# Patient Record
Sex: Female | Born: 1950 | Race: White | Hispanic: No | Marital: Married | Smoking: Never smoker
Health system: Southern US, Community
[De-identification: ages and names within clinical notes are randomized; demographics above are authoritative.]

## PROBLEM LIST (undated history)

## (undated) DIAGNOSIS — I10 Essential (primary) hypertension: Secondary | ICD-10-CM

## (undated) HISTORY — PX: TONSILLECTOMY: SUR1361

## (undated) HISTORY — PX: COLON SURGERY: SHX602

## (undated) HISTORY — PX: INCONTINENCE SURGERY: SHX676

## (undated) HISTORY — PX: ABDOMINAL SURGERY: SHX537

## (undated) HISTORY — PX: COLON RESECTION: SHX5231

---

## 2005-11-15 ENCOUNTER — Ambulatory Visit: Payer: Self-pay | Admitting: Gastroenterology

## 2005-11-15 IMAGING — NM NUCLEAR MEDICINE GASTRIC EMPTYING STUDY
1 series · 6 of 6 positions shown · non-contrast
Comparison: none

REASON FOR EXAM: GERD: Dyspepsia
COMMENTS:

[Series 0: gastric empty · 1.7mm · 1.65mm/px · 6 of 20 frames shown]
[frame 2/20]
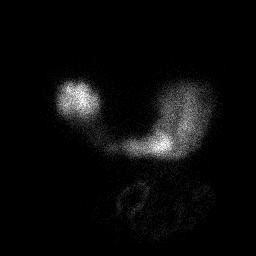
[frame 5/20]
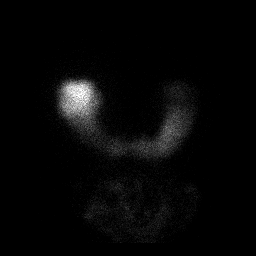
[frame 9/20]
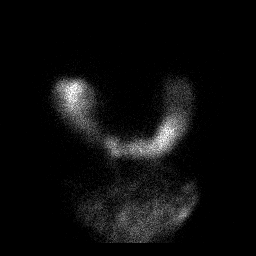
[frame 12/20]
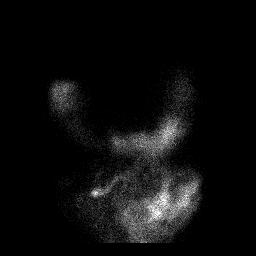
[frame 15/20]
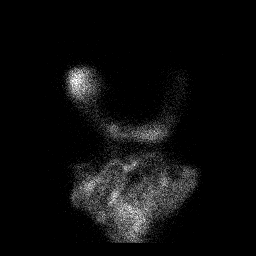
[frame 19/20]
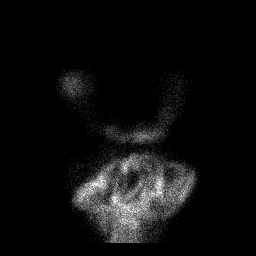

[6 of 6 positions shown; findings below may reference images not displayed]

PROCEDURE:     NM  - NM GASTRIC EMPTYING STUDY  - [DATE]  [DATE]

RESULT:          The patient has gastric reflux symptoms.  The patient's
gastric contents were labeled with 1.96 mCi of technetium 99-m labeled
sulfur colloid.  This was combined with two eggs, two slices of toast, and 8
ounces of water.

The 95-minute gastric emptying is normal at 98%.  There does not appear to
be any evidence of gastric outlet obstruction.  The normal expected upper
range of normal for gastric emptying by 95 minutes is 64%, suggesting that
the stomach is perhaps hyperdynamic, which may aggravate reflux.
IMPRESSION: There is no evidence of delay of gastric emptying with
nearly 98% of the patient's gastric contents emptied by 95 minutes.

## 2007-07-31 ENCOUNTER — Ambulatory Visit: Payer: Self-pay | Admitting: Family Medicine

## 2007-10-25 ENCOUNTER — Ambulatory Visit: Payer: Self-pay | Admitting: Family Medicine

## 2021-09-18 ENCOUNTER — Encounter: Payer: Self-pay | Admitting: Licensed Clinical Social Worker

## 2021-09-18 ENCOUNTER — Ambulatory Visit
Admission: EM | Admit: 2021-09-18 | Discharge: 2021-09-18 | Disposition: A | Discharge status: Home / Self Care | Payer: Medicare Other | Attending: Physician Assistant | Admitting: Physician Assistant

## 2021-09-18 ENCOUNTER — Other Ambulatory Visit: Payer: Self-pay

## 2021-09-18 DIAGNOSIS — N3000 Acute cystitis without hematuria: Secondary | ICD-10-CM | POA: Diagnosis present

## 2021-09-18 DIAGNOSIS — E860 Dehydration: Secondary | ICD-10-CM | POA: Diagnosis present

## 2021-09-18 LAB — URINALYSIS, COMPLETE (UACMP) WITH MICROSCOPIC
Bilirubin Urine: NEGATIVE
Glucose, UA: NEGATIVE mg/dL
Hgb urine dipstick: NEGATIVE
Ketones, ur: 15 mg/dL — AB
Nitrite: POSITIVE — AB
Protein, ur: NEGATIVE mg/dL
Specific Gravity, Urine: 1.02 (ref 1.005–1.030)
WBC, UA: >50 WBC/hpf (ref 0–5)
pH: 7 (ref 5.0–8.0)

## 2021-09-18 MED ORDER — PHENAZOPYRIDINE HCL 200 MG PO TABS: 200.0000 mg | ORAL_TABLET | Freq: Three times a day (TID) | ORAL | 0 refills | Status: DC

## 2021-09-18 MED ORDER — NITROFURANTOIN MONOHYD MACRO 100 MG PO CAPS: 100.0000 mg | ORAL_CAPSULE | Freq: Two times a day (BID) | ORAL | 0 refills | Status: AC

## 2021-09-18 NOTE — Discharge Instructions (Signed)
We are treating you for a UTI, you are mildly dehydrated. Avoid tub baths. Take meds as directed. Follow up with PCP, urine culture pending. Drink plenty of water.

## 2021-09-18 NOTE — ED Triage Notes (Signed)
Pt c/o painful urination, urgency x 1 day. Pain in bladder. Pt states she responds to macrobid antibiotic.

## 2021-09-18 NOTE — ED Provider Notes (Signed)
[facility]    CSN: [id number] Arrival date & time: 09/18/21  1428      History   Chief Complaint Chief Complaint  Patient presents with   Dysuria    HPI Kelli Carter is a 70 y.o. female.   70 year old female patient, Kelli Carter, presents to urgent care chief complaint of burning with urination and urgency starting today.  Patient states she has a history of UTIs with menopause and is on Estrace vaginal cream which has helped with this.  Patient states last UTI was over 6 months ago.  Patient endorses Macrobid as antibiotic that works for her for UTIs.  Patient denies any fever or back pain  The history is provided by the patient. No language interpreter was used.   History reviewed. No pertinent past medical history.  Patient Active Problem List   Diagnosis Date Noted   Acute cystitis without hematuria 09/18/2021   Dehydration 09/18/2021    History reviewed. No pertinent surgical history.  OB History   No obstetric history on file.      Home Medications    Prior to Admission medications   Medication Sig Start Date End Date Taking? Authorizing Provider  nitrofurantoin, macrocrystal-monohydrate, (MACROBID) 100 MG capsule Take 1 capsule (100 mg total) by mouth 2 (two) times daily for 5 days. 09/18/21 09/23/21 Yes Travian Kerner, Para March, NP  phenazopyridine (PYRIDIUM) 200 MG tablet Take 1 tablet (200 mg total) by mouth 3 (three) times daily. 09/18/21  Yes Chinaza Rooke, Para March, NP  estradiol (ESTRACE) 0.1 MG/GM vaginal cream SMARTSIG:0.25 Applicator Vaginal Twice a Week 07/27/21   [provider]    Family History History reviewed. No pertinent family history.  Social History Social History   Tobacco Use   Smoking status: Never   Smokeless tobacco: Never  Vaping Use   Vaping Use: Never used  Substance Use Topics   Alcohol use: Not Currently   Drug use: Never     Allergies   Keflex [cephalexin]   Review of Systems Review of Systems   Constitutional:  Negative for fever.  Gastrointestinal:  Negative for abdominal pain.  Genitourinary:  Positive for dysuria, frequency and urgency. Negative for flank pain.  Musculoskeletal:  Negative for back pain.  All other systems reviewed and are negative.   Physical Exam Triage Vital Signs ED Triage Vitals  Enc Vitals Group     BP 09/18/21 1512 122/81     Pulse Rate 09/18/21 1512 71     Resp 09/18/21 1512 16     Temp 09/18/21 1512 98.6 F (37 C)     Temp Source 09/18/21 1512 Oral     SpO2 09/18/21 1512 97 %     Weight 09/18/21 1505 137 lb (62.1 kg)     Height 09/18/21 1505 5\' 4"  (1.626 m)     Head Circumference --      Peak Flow --      Pain Score 09/18/21 1504 10     Pain Loc --      Pain Edu? --      Excl. in GC? --    No data found.  Updated Vital Signs BP 122/81 (BP Location: Left Arm)   Pulse 71   Temp 98.6 F (37 C) (Oral)   Resp 16   Ht 5\' 4"  (1.626 m)   Wt 137 lb (62.1 kg)   SpO2 97%   BMI 23.52 kg/m   Visual Acuity Right Eye Distance:   Left Eye Distance:   Bilateral Distance:  Right Eye Near:   Left Eye Near:    Bilateral Near:     Physical Exam Vitals and nursing note reviewed.  Constitutional:      General: She is not in acute distress.    Appearance: Normal appearance. She is well-developed and well-groomed.  HENT:     Head: Normocephalic.  Eyes:     Pupils: Pupils are equal, round, and reactive to light.  Cardiovascular:     Rate and Rhythm: Normal rate and regular rhythm.  Pulmonary:     Effort: Pulmonary effort is normal.     Breath sounds: Normal breath sounds.  Abdominal:     Tenderness: There is no abdominal tenderness.  Musculoskeletal:        General: Normal range of motion.     Cervical back: Normal range of motion.     Comments: Negative CVAT bilaterally  Skin:    General: Skin is warm and dry.  Neurological:     General: No focal deficit present.     Mental Status: She is alert and oriented to person, place,  and time.     GCS: GCS eye subscore is 4. GCS verbal subscore is 5. GCS motor subscore is 6.  Psychiatric:        Attention and Perception: Attention normal.        Mood and Affect: Mood normal.        Speech: Speech normal.        Behavior: Behavior normal. Behavior is cooperative.     UC Treatments / Results  Labs (all labs ordered are listed, but only abnormal results are displayed) Labs Reviewed  URINALYSIS, COMPLETE (UACMP) WITH MICROSCOPIC - Abnormal; Notable for the following components:      Result Value   APPearance HAZY (*)    Ketones, ur 15 (*)    Nitrite POSITIVE (*)    Leukocytes,Ua MODERATE (*)    Bacteria, UA MANY (*)    All other components within normal limits  URINE CULTURE    EKG   Radiology No results found.  Procedures Procedures (including critical care time)  Medications Ordered in UC Medications - No data to display  Initial Impression / Assessment and Plan / UC Course  I have reviewed the triage vital signs and the nursing notes.  Pertinent labs & imaging results that were available during my care of the patient were reviewed by me and considered in my medical decision making (see chart for details).     Ddx: UTI, Interstitial cystitis, pyelonephritis Final Clinical Impressions(s) / UC Diagnoses   Final diagnoses:  Acute cystitis without hematuria  Dehydration     Discharge Instructions      We are treating you for a UTI, you are mildly dehydrated. Avoid tub baths. Take meds as directed. Follow up with PCP, urine culture pending. Drink plenty of water.      ED Prescriptions     Medication Sig Dispense Auth. Provider   nitrofurantoin, macrocrystal-monohydrate, (MACROBID) 100 MG capsule Take 1 capsule (100 mg total) by mouth 2 (two) times daily for 5 days. 10 capsule Aamari Strawderman, Para March, NP   phenazopyridine (PYRIDIUM) 200 MG tablet Take 1 tablet (200 mg total) by mouth 3 (three) times daily. 6 tablet Latisha Lasch, Para March, NP       PDMP not reviewed this encounter.   Clancy Gourd, NP 09/18/21 1539

## 2021-09-20 LAB — URINE CULTURE: Culture: >=100000 — AB

## 2022-03-15 ENCOUNTER — Encounter: Payer: Self-pay | Admitting: Emergency Medicine

## 2022-03-15 ENCOUNTER — Ambulatory Visit
Admission: EM | Admit: 2022-03-15 | Discharge: 2022-03-15 | Disposition: A | Discharge status: Home / Self Care | Payer: Medicare Other | Attending: Internal Medicine | Admitting: Internal Medicine

## 2022-03-15 ENCOUNTER — Other Ambulatory Visit: Payer: Self-pay

## 2022-03-15 DIAGNOSIS — M79651 Pain in right thigh: Secondary | ICD-10-CM

## 2022-03-15 DIAGNOSIS — M79652 Pain in left thigh: Secondary | ICD-10-CM | POA: Diagnosis not present

## 2022-03-15 HISTORY — DX: Essential (primary) hypertension: I10

## 2022-03-15 MED ORDER — CYCLOBENZAPRINE HCL 5 MG PO TABS: 5.0000 mg | ORAL_TABLET | Freq: Every evening | ORAL | 0 refills | Status: DC | PRN

## 2022-03-15 NOTE — ED Triage Notes (Signed)
Patient c/o pain that starts in her upper thigh that travels down her left leg that started 4:15 today.  Patient denies injury or falls.  Patient can find no relief with lying or walking.  ?

## 2022-03-15 NOTE — Discharge Instructions (Signed)
Heating pad use will help with pain ?Gentle stretching exercises ?Please take medications as prescribed ?Please alternate ibuprofen and Tylenol as needed for pain ?If you have persistent pain or the pain becomes constant or does not improve in the next 24 hours please return to urgent care to be reevaluated. ?No indication for imaging studies at this time. ?Please do not drive or operate heavy machinery after taking muscle relaxants. ?

## 2022-03-18 NOTE — ED Provider Notes (Signed)
?[facility] ? ? ? ?CSN: [id number] ?Arrival date & time: 03/15/22  1814 ? ? ?  ? ?History   ?Chief Complaint ?Chief Complaint  ?Patient presents with  ? Leg Pain  ?  left  ? ? ?HPI ?Kelli Carter is a 71 y.o. female comes to urgent care with upper thigh pain that travels down her left leg.  Patient's symptoms started when she lifted up a heavy object this afternoon.  Onset of pain was sudden.  Pain is sharp, severe and shoots down the left leg.  Pain lasts for few seconds and recurs with the same intensity.  She is having some spasms in the left thigh.  No improvement in symptoms with over-the-counter medication.  No rash or bruising noted.  No falls or direct trauma to the left leg.  Patient denies any back pain.  ? ?HPI ? ?Past Medical History:  ?Diagnosis Date  ? Hypertension   ? ? ?Patient Active Problem List  ? Diagnosis Date Noted  ? Acute cystitis without hematuria 09/18/2021  ? Dehydration 09/18/2021  ? ? ?Past Surgical History:  ?Procedure Laterality Date  ? ABDOMINAL SURGERY    ? INCONTINENCE SURGERY    ? TONSILLECTOMY    ? ? ?OB History   ?No obstetric history on file. ?  ? ? ? ?Home Medications   ? ?Prior to Admission medications   ?Medication Sig Start Date End Date Taking? Authorizing Provider  ?cyclobenzaprine (FLEXERIL) 5 MG tablet Take 1 tablet (5 mg total) by mouth at bedtime as needed for muscle spasms. 03/15/22  Yes Valeta Paz, Britta Mccreedy, MD  ?lisinopril (ZESTRIL) 20 MG tablet Take by mouth. 02/28/22  Yes [provider]  ?atenolol (TENORMIN) 25 MG tablet Take 25 mg by mouth 2 (two) times daily. 03/13/22   [provider]  ?calcium carbonate (OS-CAL - DOSED IN MG OF ELEMENTAL CALCIUM) 1250 (500 Ca) MG tablet Take by mouth.    [provider]  ?estradiol (ESTRACE) 0.1 MG/GM vaginal cream SMARTSIG:0.25 Applicator Vaginal Twice a Week 07/27/21   [provider]  ?hydrochlorothiazide (HYDRODIURIL) 12.5 MG tablet Take 12.5 mg by mouth daily. 01/09/22   [provider]  ?simvastatin (ZOCOR) 40 MG tablet Take 40 mg by mouth at bedtime. 02/02/22   [provider]  ?traZODone (DESYREL) 50 MG tablet Take 50 mg by mouth at bedtime. 02/02/22   [provider]  ? ? ?Family History ?History reviewed. No pertinent family history. ? ?Social History ?Social History  ? ?Tobacco Use  ? Smoking status: Never  ? Smokeless tobacco: Never  ?Vaping Use  ? Vaping Use: Never used  ?Substance Use Topics  ? Alcohol use: Not Currently  ? Drug use: Never  ? ? ? ?Allergies   ?Azithromycin, Cephalosporins, and Keflex [cephalexin] ? ? ?Review of Systems ?Review of Systems ?As per HPI ? ?Physical Exam ?Triage Vital Signs ?ED Triage Vitals  ?Enc Vitals Group  ?   BP 03/15/22 1834 (!) 149/68  ?   Pulse Rate 03/15/22 1834 61  ?   Resp 03/15/22 1834 14  ?   Temp 03/15/22 1834 97.8 ?F (36.6 ?C)  ?   Temp Source 03/15/22 1834 Oral  ?   SpO2 03/15/22 1834 99 %  ?   Weight 03/15/22 1830 136 lb 14.5 oz (62.1 kg)  ?   Height --   ?   Head Circumference --   ?   Peak Flow --   ?   Pain Score  03/15/22 1829 10  ?   Pain Loc --   ?   Pain Edu? --   ?   Excl. in GC? --   ? ?No data found. ? ?Updated Vital Signs ?BP (!) 149/68 (BP Location: Left Arm)   Pulse 61   Temp 97.8 ?F (36.6 ?C) (Oral)   Resp 14   Wt 62.1 kg   SpO2 99%   BMI 23.50 kg/m?  ? ?Visual Acuity ?Right Eye Distance:   ?Left Eye Distance:   ?Bilateral Distance:   ? ?Right Eye Near:   ?Left Eye Near:    ?Bilateral Near:    ? ?Physical Exam ?Vitals and nursing note reviewed.  ?Constitutional:   ?   General: She is in acute distress.  ?   Appearance: She is not ill-appearing.  ?Cardiovascular:  ?   Rate and Rhythm: Normal rate and regular rhythm.  ?Musculoskeletal:  ?   Comments: Full range of motion of the left hip.  Tenderness on palpation over the lateral aspect of the proximal aspect of left thigh and over the greater trochanteric bursa of the left hip.  No rash noted.  ?Neurological:  ?   Mental Status: She is alert.   ? ? ? ?UC Treatments / Results  ?Labs ?(all labs ordered are listed, but only abnormal results are displayed) ?Labs Reviewed - No data to display ? ?EKG ? ? ?Radiology ?No results found. ? ?Procedures ?Procedures (including critical care time) ? ?Medications Ordered in UC ?Medications - No data to display ? ?Initial Impression / Assessment and Plan / UC Course  ?I have reviewed the triage vital signs and the nursing notes. ? ?Pertinent labs & imaging results that were available during my care of the patient were reviewed by me and considered in my medical decision making (see chart for details). ? ?  ? ?1.  Left thigh pain: ?This is likely secondary to heavy lifting ?Flexeril at bedtime as needed for spasms ?Gentle stretching exercises ?Heating pad use ?Continue anti-inflammatory medications ?Return to urgent care if symptoms worsen. ?Final Clinical Impressions(s) / UC Diagnoses  ? ?Final diagnoses:  ?Left thigh pain  ? ? ? ?Discharge Instructions   ? ?  ?Heating pad use will help with pain ?Gentle stretching exercises ?Please take medications as prescribed ?Please alternate ibuprofen and Tylenol as needed for pain ?If you have persistent pain or the pain becomes constant or does not improve in the next 24 hours please return to urgent care to be reevaluated. ?No indication for imaging studies at this time. ?Please do not drive or operate heavy machinery after taking muscle relaxants. ? ? ? ?ED Prescriptions   ? ? Medication Sig Dispense Auth. Provider  ? cyclobenzaprine (FLEXERIL) 5 MG tablet Take 1 tablet (5 mg total) by mouth at bedtime as needed for muscle spasms. 5 tablet Rosely Fernandez, Britta Mccreedy, MD  ? ?  ? ?PDMP not reviewed this encounter. ?  ?Merrilee Jansky, MD ?03/18/22 1159 ? ?

## 2022-08-19 ENCOUNTER — Ambulatory Visit
Admission: EM | Admit: 2022-08-19 | Discharge: 2022-08-19 | Disposition: A | Discharge status: Home / Self Care | Payer: Medicare Other

## 2022-08-19 DIAGNOSIS — S61213A Laceration without foreign body of left middle finger without damage to nail, initial encounter: Secondary | ICD-10-CM | POA: Diagnosis not present

## 2022-08-19 NOTE — Discharge Instructions (Addendum)
-  I have applied Dermabond/tissue adhesive to repair the laceration of the finger. - Trying not to get this wet or it can ruin the integrity of the skin adhesive and come off sooner.  Should come off on its own in 5 to 7 days or maybe sooner than that.  Should peel off. - Change the overlying bandage or replace the Band-Aid every day. - Look out for signs of infection including increased redness, swelling or pain.  Return to our department for signs of infection.

## 2022-08-19 NOTE — ED Triage Notes (Signed)
Pt c/o cut on her finger while chopping cucumber on 08/18/22.  Pt states that she was startled by her husband and sliced across the tip of the 3rd finger on the left hand.  Pt states that it currently does not hurt.  Pt states that the skin is still attached to her finger and she put it back into place and bandaged it last night while preparing food.

## 2022-08-19 NOTE — ED Provider Notes (Signed)
[facility]    CSN: [id number] Arrival date & time: 08/19/22  1320      History   Chief Complaint Chief Complaint  Patient presents with   Finger Injury    HPI Kelli Carter is a 71 y.o. female presenting for evaluation of laceration of the middle finger of the left hand.  Patient reports that she was cutting cucumber when her husband started vacuuming behind her and suddenly startled her.  She reports that she sliced her finger with a very clean knife.  She says that she immediately rinsed the finger and push the flap back over top of the wound.  Reports that she kept it bandaged.  Patient says she noticed a black line around the wound and does not know if it is necrotic tissue or blood under the skin but she thought she should have it evaluated.  She reports that she had significant pain initially but no significant pain at this time unless the area is touched.  Is not actively bleeding at this time.  Per care everywhere, patient's last tetanus immunization was May 2015.  She is denying any numbness or weakness.  HPI  Past Medical History:  Diagnosis Date   Hypertension     Patient Active Problem List   Diagnosis Date Noted   Acute cystitis without hematuria 09/18/2021   Dehydration 09/18/2021    Past Surgical History:  Procedure Laterality Date   ABDOMINAL SURGERY     INCONTINENCE SURGERY     TONSILLECTOMY      OB History   No obstetric history on file.      Home Medications    Prior to Admission medications   Medication Sig Start Date End Date Taking? Authorizing Provider  atenolol (TENORMIN) 25 MG tablet Take 25 mg by mouth 2 (two) times daily. 03/13/22  Yes [provider]  calcium carbonate (OS-CAL - DOSED IN MG OF ELEMENTAL CALCIUM) 1250 (500 Ca) MG tablet Take by mouth.   Yes [provider]  cyclobenzaprine (FLEXERIL) 5 MG tablet Take 1 tablet (5 mg total) by mouth at bedtime as needed for muscle spasms. 03/15/22  Yes Lamptey,  Myrene Galas, MD  estradiol (ESTRACE) 0.1 MG/GM vaginal cream [id number] Applicator Vaginal Twice a Week 07/27/21  Yes [provider]  hydrochlorothiazide (HYDRODIURIL) 12.5 MG tablet Take 12.5 mg by mouth daily. 01/09/22  Yes [provider]  lisinopril (ZESTRIL) 20 MG tablet Take by mouth. 02/28/22  Yes [provider]  simvastatin (ZOCOR) 40 MG tablet Take 40 mg by mouth at bedtime. 02/02/22  Yes [provider]  traZODone (DESYREL) 50 MG tablet Take 50 mg by mouth at bedtime. 02/02/22  Yes [provider]    Family History History reviewed. No pertinent family history.  Social History Social History   Tobacco Use   Smoking status: Never   Smokeless tobacco: Never  Vaping Use   Vaping Use: Never used  Substance Use Topics   Alcohol use: Yes   Drug use: Never     Allergies   Azithromycin, Cephalosporins, and Keflex [cephalexin]   Review of Systems Review of Systems  Musculoskeletal:  Negative for arthralgias and joint swelling.  Skin:  Positive for wound. Negative for color change.  Neurological:  Negative for weakness and numbness.     Physical Exam Triage Vital Signs ED Triage Vitals  Enc Vitals Group     BP      Pulse      Resp  Temp      Temp src      SpO2      Weight      Height      Head Circumference      Peak Flow      Pain Score      Pain Loc      Pain Edu?      Excl. in [place]?    No data found.  Updated Vital Signs BP (!) 156/73 (BP Location: Left Arm)   Pulse 99   Temp 98.7 F (37.1 C) (Oral)   Resp 18   Ht 5\' 3"  (1.6 m)   Wt 147 lb (66.7 kg)   SpO2 95%   BMI 26.04 kg/m    Physical Exam Vitals and nursing note reviewed.  Constitutional:      General: She is not in acute distress.    Appearance: Normal appearance. She is not ill-appearing or toxic-appearing.  HENT:     Head: Normocephalic and atraumatic.  Eyes:     General: No scleral icterus.       Right eye: No discharge.        Left  eye: No discharge.     Conjunctiva/sclera: Conjunctivae normal.  Cardiovascular:     Rate and Rhythm: Normal rate.     Pulses: Normal pulses.  Pulmonary:     Effort: Pulmonary effort is normal. No respiratory distress.  Musculoskeletal:     Cervical back: Neck supple.  Skin:    General: Skin is dry.     Comments: 8 mm superficial flap laceration of the distal finger pad of the third digit of the left hand.  No active bleeding.  I lifted back the skin flap and see dried blood.  Area cleansed thoroughly with antiseptic.  Area is tender to palpation.  She has full range of motion of this finger and good strength.  Good pulses.  Neurological:     General: No focal deficit present.     Mental Status: She is alert. Mental status is at baseline.     Motor: No weakness.  Psychiatric:        Mood and Affect: Mood normal.        Behavior: Behavior normal.      UC Treatments / Results  Labs (all labs ordered are listed, but only abnormal results are displayed) Labs Reviewed - No data to display  EKG   Radiology No results found.  Procedures Procedures (including critical care time)  8 mm semicircular flap laceration distal left middle finger.  Consent obtained by patient for wound care and repair.  Area cleansed thoroughly with antiseptic under the flap.  Flap pushed back down and repaired with Dermabond.  After.  Dry, applied nonadherent pad and [place].  Patient tolerated well.  Medications Ordered in UC Medications - No data to display  Initial Impression / Assessment and Plan / UC Course  I have reviewed the triage vital signs and the nursing notes.  Pertinent labs & imaging results that were available during my care of the patient were reviewed by me and considered in my medical decision making (see chart for details).   71 year old female presenting for superficial flap laceration of the middle finger of the left hand that occurred last night when she excellently cut her  finger with a kitchen knife.  Tetanus up-to-date.  After patient gives consent, I lifted back the flap of the skin and cleansed thoroughly with antiseptic and then folded back over and  dried it well.  Laceration repaired with Dermabond.  Reviewed changing bandage daily.  Reviewed close monitoring and return if any signs of infection or worsening pain.   Final Clinical Impressions(s) / UC Diagnoses   Final diagnoses:  Laceration of left middle finger without foreign body without damage to nail, initial encounter     Discharge Instructions      -I have applied Dermabond/tissue adhesive to repair the laceration of the finger. - Trying not to get this wet or it can ruin the integrity of the skin adhesive and come off sooner.  Should come off on its own in 5 to 7 days or maybe sooner than that.  Should peel off. - Change the overlying bandage or replace the Band-Aid every day. - Look out for signs of infection including increased redness, swelling or pain.  Return to our department for signs of infection.     ED Prescriptions   None    PDMP not reviewed this encounter.   Danton Clap, PA-C 08/19/22 1557

## 2023-09-16 ENCOUNTER — Other Ambulatory Visit: Payer: Self-pay

## 2023-09-16 ENCOUNTER — Ambulatory Visit
Admission: EM | Admit: 2023-09-16 | Discharge: 2023-09-16 | Disposition: A | Discharge status: Home / Self Care | Payer: Medicare Other

## 2023-09-16 DIAGNOSIS — I7389 Other specified peripheral vascular diseases: Secondary | ICD-10-CM | POA: Diagnosis not present

## 2023-09-16 NOTE — Discharge Instructions (Signed)
-  I believe your symptoms are due to primary acrocyanosis or possibly just bruising.  -If it is primary acrocyanosis symptoms typically resolve within 24 to 72 hours.  You may try sticking your finger in warm water which may help. - If you notice similar symptoms in other fingers or you feel pain, like your fingertip is cold or began to have chest pain, shortness of breath, you need to be seen again right away.  If this is a recurrent issue follow-up with your primary care provider.

## 2023-09-16 NOTE — ED Triage Notes (Addendum)
Noticed that the 2nd digit of left hand is discolored. Denies injury. Finger red and edematous pt denies pain

## 2023-09-16 NOTE — ED Provider Notes (Signed)
[facility]    CSN: [id number] Arrival date & time: 09/16/23  1812      History   Chief Complaint Chief Complaint  Patient presents with   finger swelling    HPI Kelli Carter is a 72 y.o. female presenting for painless swelling and bluish discoloration of the fingertip of the index finger on the left hand.  She denies injury.  She says she was holding her phone in an awkward position for a while.  She just noticed this in the past hour.  No history of similar symptoms.  No other fingers are affected.  Denies bluish discoloration anywhere else.  Denies breathing issue, chest pain.  No history of Raynaud's or autoimmune disease.  No history of vascular problems.  No treatment so far.  HPI  Past Medical History:  Diagnosis Date   Hypertension     Patient Active Problem List   Diagnosis Date Noted   Acute cystitis without hematuria 09/18/2021   Dehydration 09/18/2021    Past Surgical History:  Procedure Laterality Date   ABDOMINAL SURGERY     COLON RESECTION     COLON SURGERY     INCONTINENCE SURGERY     TONSILLECTOMY      OB History   No obstetric history on file.      Home Medications    Prior to Admission medications   Medication Sig Start Date End Date Taking? Authorizing Provider  atenolol (TENORMIN) 25 MG tablet Take 25 mg by mouth 2 (two) times daily. 03/13/22  Yes [provider]  estradiol (ESTRACE) 0.1 MG/GM vaginal cream SMARTSIG:0.25 Applicator Vaginal Twice a Week 07/27/21  Yes [provider]  hydrochlorothiazide (HYDRODIURIL) 12.5 MG tablet Take 12.5 mg by mouth daily. 01/09/22  Yes [provider]  lisinopril (ZESTRIL) 20 MG tablet Take by mouth. 02/28/22  Yes [provider]  simvastatin (ZOCOR) 40 MG tablet Take 40 mg by mouth at bedtime. 02/02/22  Yes [provider]  traZODone (DESYREL) 50 MG tablet Take 50 mg by mouth at bedtime. 02/02/22  Yes [provider]  calcium carbonate (OS-CAL  - DOSED IN MG OF ELEMENTAL CALCIUM) 1250 (500 Ca) MG tablet Take by mouth.    [provider]  cyclobenzaprine (FLEXERIL) 5 MG tablet Take 1 tablet (5 mg total) by mouth at bedtime as needed for muscle spasms. 03/15/22   Lamptey, Britta Mccreedy, MD    Family History No family history on file.  Social History Social History   Tobacco Use   Smoking status: Never   Smokeless tobacco: Never  Vaping Use   Vaping status: Never Used  Substance Use Topics   Alcohol use: Yes   Drug use: Never     Allergies   Azithromycin, Cephalosporins, and Keflex [cephalexin]   Review of Systems Review of Systems  Constitutional:  Negative for fatigue and fever.  Musculoskeletal:  Positive for joint swelling. Negative for arthralgias.  Skin:  Positive for color change. Negative for wound.  Neurological:  Negative for weakness and numbness.  Hematological:  Does not bruise/bleed easily.     Physical Exam Triage Vital Signs ED Triage Vitals  Encounter Vitals Group     BP 09/16/23 1836 (!) 141/87     Systolic BP Percentile --      Diastolic BP Percentile --      Pulse Rate 09/16/23 1836 (!) 108     Resp 09/16/23 1836 20     Temp 09/16/23 1836 98.1 F (36.7 C)  Temp src --      SpO2 09/16/23 1836 96 %     Weight --      Height --      Head Circumference --      Peak Flow --      Pain Score 09/16/23 1832 0     Pain Loc --      Pain Education --      Exclude from Growth Chart --    No data found.  Updated Vital Signs   Physical Exam Vitals and nursing note reviewed.  Constitutional:      General: She is not in acute distress.    Appearance: Normal appearance. She is not ill-appearing or toxic-appearing.  HENT:     Head: Normocephalic and atraumatic.  Eyes:     General: No scleral icterus.       Right eye: No discharge.        Left eye: No discharge.     Conjunctiva/sclera: Conjunctivae normal.  Cardiovascular:     Rate and Rhythm: Regular rhythm. Tachycardia present.      Pulses: Normal pulses.     Heart sounds: Normal heart sounds.  Pulmonary:     Effort: Pulmonary effort is normal. No respiratory distress.  Musculoskeletal:     Cervical back: Neck supple.  Skin:    General: Skin is dry.     Comments: LEFT HAND: Normal pulses. There is bluish discoloration of the fingertip of the left index finger. Normal cap refill. Normal warmth compared to other fingers. No tenderness. Full ROM.  Neurological:     General: No focal deficit present.     Mental Status: She is alert. Mental status is at baseline.     Motor: No weakness.     Gait: Gait normal.  Psychiatric:        Mood and Affect: Mood normal.         UC Treatments / Results  Labs (all labs ordered are listed, but only abnormal results are displayed) Labs Reviewed - No data to display  EKG   Radiology No results found.  Procedures Procedures (including critical care time)  Medications Ordered in UC Medications - No data to display  Initial Impression / Assessment and Plan / UC Course  I have reviewed the triage vital signs and the nursing notes.  Pertinent labs & imaging results that were available during my care of the patient were reviewed by me and considered in my medical decision making (see chart for details).   72 year old female presents for sudden onset of painless bluish discoloration and swelling of the tip of the left index finger over the past hour.  Denies injury.  See image included in chart.  The finger is warm with normal pulses.  Normal capillary refill.  Full range of motion.  Bluish-purple discoloration noted.  Patient has no history of Raynaud's or autoimmune disease.  No history of cardiovascular problems.  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  Before discharge the tip of the finger started to lighten up and began to return to normal color.  Advised supportive care with applying warm compresses to the finger.   Explained that this should self resolve within 24 to 72 hours with thoroughly discussed return and ER precautions.   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  -If it is primary acrocyanosis symptoms typically resolve within 24 to 72 hours.  You may try sticking your finger in warm water which may help. - If you notice similar symptoms in other fingers or you feel pain, like your fingertip is cold or began to have chest pain, shortness of breath, you need to be seen again right away.  If this is a recurrent issue follow-up with your primary care provider.     ED Prescriptions   None    PDMP not reviewed this encounter.   Shirlee Latch, PA-C 09/16/23 [phone]

## 2024-10-12 ENCOUNTER — Ambulatory Visit
Admission: EM | Admit: 2024-10-12 | Discharge: 2024-10-12 | Disposition: A | Discharge status: Home / Self Care | Attending: Emergency Medicine | Admitting: Emergency Medicine

## 2024-10-12 ENCOUNTER — Encounter: Payer: Self-pay | Admitting: Emergency Medicine

## 2024-10-12 ENCOUNTER — Ambulatory Visit (INDEPENDENT_AMBULATORY_CARE_PROVIDER_SITE_OTHER)

## 2024-10-12 DIAGNOSIS — S6991XA Unspecified injury of right wrist, hand and finger(s), initial encounter: Secondary | ICD-10-CM

## 2024-10-12 DIAGNOSIS — M79644 Pain in right finger(s): Secondary | ICD-10-CM

## 2024-10-12 MED ORDER — DICLOFENAC SODIUM 1 % EX GEL: 2.0000 g | Freq: Four times a day (QID) | CUTANEOUS | 0 refills | Status: AC

## 2024-10-12 NOTE — ED Triage Notes (Signed)
 Pt hit her right thumb against a piece of firewood 1 week ago. She has a lot of pain even when she is not moving her hand. She has taken OTC pain medication with no relief.

## 2024-10-12 NOTE — Discharge Instructions (Addendum)
 I talked to radiology, and there is no apparent scaphoid fracture, but the trapezium which is the bone at the base your thumb, does not appear entirely normal.  As we discussed, not all scaphoid fractures show up immediately, and with current standard is to have it reimaged 10 days after the injury.  You also may have a small chip fracture at the distal phalanx.  Radiology recommends advanced imaging such as CT or MRI, which can be done at the orthopedic surgeons office.  I would follow within 4 to 5 days for reimaging and further evaluation.  Stop the ibuprofen for now.  You can try Voltaren topical gel 4 times a day.  Increase Tylenol to 1000 mg 3-4 times a day.  Wear the thumb spica at all times until seen by orthopedics.

## 2024-10-12 NOTE — ED Provider Notes (Signed)
 HPI  SUBJECTIVE:  Kelli Carter is a right-handed 73 y.o. female who presents with right snuffbox/first metacarpal pain starting 6 days ago after having a log of firewood accidentally fell out of her arms, landing onto this area of the hand.  She describes the pain as stabbing, sharp and constant.  She reports grip weakness secondary to pain and limited range of motion of the thumb.  No numbness or tingling, bruising, injury to the wrist, she is able to move her other fingers without any problem.  Denies injury to the wrist.  She has been taking ibuprofen 800 mg with 500 mg daily without improvement in her symptoms.  Symptoms are worse whenever she tries to move her thumb and are present even at rest She has a past medical history of hypertension.  No history of chronic kidney disease, osteoporosis.  PCP: Madie Aris  Past Medical History:  Diagnosis Date   Hypertension     Past Surgical History:  Procedure Laterality Date   ABDOMINAL SURGERY     COLON RESECTION     COLON SURGERY     INCONTINENCE SURGERY     TONSILLECTOMY      No family history on file.  Social History   Tobacco Use   Smoking status: Never   Smokeless tobacco: Never  Vaping Use   Vaping status: Never Used  Substance Use Topics   Alcohol use: Yes   Drug use: Never    No current facility-administered medications for this encounter.  Current Outpatient Medications:    diclofenac Sodium (VOLTAREN) 1 % GEL, Apply 2 g topically 4 (four) times daily for 7 days., Disp: 50 g, Rfl: 0   atenolol (TENORMIN) 25 MG tablet, Take 25 mg by mouth 2 (two) times daily., Disp: , Rfl:    calcium carbonate (OS-CAL - DOSED IN MG OF ELEMENTAL CALCIUM) 1250 (500 Ca) MG tablet, Take by mouth., Disp: , Rfl:    estradiol (ESTRACE) 0.1 MG/GM vaginal cream, SMARTSIG:0.25 Applicator Vaginal Twice a Week, Disp: , Rfl:    hydrochlorothiazide (HYDRODIURIL) 12.5 MG tablet, Take 12.5 mg by mouth daily., Disp: , Rfl:    lisinopril (ZESTRIL)  20 MG tablet, Take by mouth., Disp: , Rfl:    simvastatin (ZOCOR) 40 MG tablet, Take 40 mg by mouth at bedtime., Disp: , Rfl:    traZODone (DESYREL) 50 MG tablet, Take 50 mg by mouth at bedtime., Disp: , Rfl:   Allergies  Allergen Reactions   Azithromycin Hives and Swelling    Hives in left eye and left cheek; angioedema Reaction occurred on 04/10/14 after taking dose 4 out of 5 from Z-Pak Hives in left eye and left cheek; angioedema Reaction occurred on 04/10/14 after taking dose 4 out of 5 from Z-Pak  Patient is uncertain if the hives were related to something else she had going on at the time or the azithromycin    Cephalosporins Rash   Keflex [Cephalexin] Rash     ROS  As noted in HPI.   Physical Exam  BP 112/74 (BP Location: Left Arm)   Pulse 63   Temp 97.6 F (36.4 C) (Oral)   Resp 16   Wt 68.9 kg   SpO2 98%   BMI 26.93 kg/m   Constitutional: Well developed, well nourished, no acute distress Eyes:  EOMI, conjunctiva normal bilaterally HENT: Normocephalic, atraumatic,mucus membranes moist Respiratory: Normal inspiratory effort Cardiovascular: Normal rate GI: nondistended skin: No rash, skin intact Musculoskeletal: Right hand: Exquisite tenderness at the scaphoid and  along the first metacarpal.  Mild tenderness at the IP joint.  No bruising, swelling.  Patient able to flex/extend at the MCP, IP joint against resistance without any problem.  2 point discrimination intact.  Sensation and motor intact in the median/radial/ulnar nerve distribution.  RP 2+.  Skin intact.  The rest of the hand, wrist within normal limits, nontender.   Neurologic: Alert & oriented x 3, no focal neuro deficits Psychiatric: Speech and behavior appropriate   ED Course   Medications - No data to display  Orders Placed This Encounter  Procedures   DG Finger Thumb Right    Standing Status:   Standing    Number of Occurrences:   1    Reason for Exam (SYMPTOM  OR DIAGNOSIS REQUIRED):    pain x 1 week after banging thumb on a piece of wood   DG Hand Complete Right    Standing Status:   Standing    Number of Occurrences:   1    Reason for Exam (SYMPTOM  OR DIAGNOSIS REQUIRED):   hit right thumb against a piece of fire wood x1 week ago    No results found for this or any previous visit (from the past 24 hours). DG Hand Complete Right Addendum Date: 10/12/2024 ADDENDUM REPORT: 10/12/2024 18:26 ADDENDUM: Upon further evaluation, a cortical deformity of indeterminate age is seen involving the lateral border of the right trapezium bone. Given the patient's history and presence of point tenderness within this region on physical examination, and acute fracture cannot be excluded. CT correlation is recommended. Electronically Signed   By: Suzen Dials M.D.   On: 10/12/2024 18:26   Result Date: 10/12/2024 CLINICAL DATA:  Status post trauma. EXAM: RIGHT HAND - COMPLETE 3+ VIEW COMPARISON:  None Available. FINDINGS: A tiny fracture deformity of indeterminate age is suspected in region of the base of the distal phalanx of the right thumb. There is no evidence of dislocation. There is no evidence of arthropathy or other focal bone abnormality. Soft tissues are unremarkable. IMPRESSION: Tiny fracture deformity of indeterminate age in the region of the base of the distal phalanx of the right thumb. Correlation with physical examination is recommended to determine the presence of point tenderness. Electronically Signed: By: Suzen Dials M.D. On: 10/12/2024 18:05   DG Finger Thumb Right Result Date: 10/12/2024 CLINICAL DATA:  Status post trauma. EXAM: RIGHT THUMB 2+V COMPARISON:  None Available. FINDINGS: A tiny fracture deformity is suspected within the region of the base of the distal phalanx of the right thumb. There is no evidence of dislocation. There is no evidence of arthropathy or other focal bone abnormality. Mild diffuse soft tissue swelling is noted. IMPRESSION: Findings  suspicious for a tiny fracture deformity within the region of the base of the distal phalanx of the right thumb. Electronically Signed   By: Suzen Dials M.D.   On: 10/12/2024 18:04    ED Clinical Impression  1. Injury of right thumb, initial encounter   2. Thumb pain, right      ED Assessment/Plan    Outside labs reviewed.  Last BMP available in November 2024.  Calculated creatinine clearance 41 mL/min.  eGFR 95 mL/min.  Reviewed imaging independently and dicussed with radiology. Small chip frature base of distal phalanx.  Cortical deformity of the trapezium of indeterminate age, no apparent scaphoid fracture.  Radiology recommends advanced imaging to rule out fracture.  See radiology report for full details.  Placed in a thumb spica.  Discussed  with patient that not all fractures show up acutely.  Will have her follow-up with [facility] in 4 to 5 days for repeat films, possibly advanced imaging.  She declined a prescription of Norco.  Will have her stop the ibuprofen but start topical diclofenac-no dosing adjustment necessary for eGFR above 30 mL/min-and increase the Tylenol to 1000 mg 3-4 times a day as needed for pain.  Discussed labs, imaging, MDM, treatment plan, and plan for follow-up with patient. patient agrees with plan.   Meds ordered this encounter  Medications   diclofenac Sodium (VOLTAREN) 1 % GEL    Sig: Apply 2 g topically 4 (four) times daily for 7 days.    Dispense:  50 g    Refill:  0      *This clinic note was created using Scientist, clinical (histocompatibility and immunogenetics). Therefore, there may be occasional mistakes despite careful proofreading.  ?    Van Knee, MD 10/13/24 [phone]
